# Patient Record
Sex: Male | Born: 2005 | Marital: Single | Smoking: Never smoker
Health system: Southern US, Community
[De-identification: ages and names within clinical notes are randomized; demographics above are authoritative.]

## PROBLEM LIST (undated history)

## (undated) DIAGNOSIS — N390 Urinary tract infection, site not specified: Secondary | ICD-10-CM

## (undated) DIAGNOSIS — R6339 Other feeding difficulties: Secondary | ICD-10-CM

## (undated) DIAGNOSIS — IMO0002 Reserved for concepts with insufficient information to code with codable children: Secondary | ICD-10-CM

## (undated) DIAGNOSIS — R633 Feeding difficulties: Secondary | ICD-10-CM

## (undated) DIAGNOSIS — T7840XA Allergy, unspecified, initial encounter: Secondary | ICD-10-CM

## (undated) HISTORY — DX: Reserved for concepts with insufficient information to code with codable children: IMO0002

## (undated) HISTORY — DX: Other feeding difficulties: R63.39

## (undated) HISTORY — DX: Feeding difficulties: R63.3

## (undated) HISTORY — DX: Urinary tract infection, site not specified: N39.0

## (undated) HISTORY — DX: Allergy, unspecified, initial encounter: T78.40XA

---
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
## 2024-01-03 ENCOUNTER — Ambulatory Visit: Admitting: Neurology

## 2024-01-06 ENCOUNTER — Ambulatory Visit: Admitting: Neurology
